# Patient Record
Sex: Female | Born: 2000 | Hispanic: No | Marital: Single | Smoking: Never smoker
Health system: Southern US, Community
[De-identification: ages and names within clinical notes are randomized; demographics above are authoritative.]

## PROBLEM LIST (undated history)

## (undated) HISTORY — PX: LYMPH NODE BIOPSY: SHX201

---
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
## 2021-11-17 ENCOUNTER — Ambulatory Visit (INDEPENDENT_AMBULATORY_CARE_PROVIDER_SITE_OTHER): Payer: No Typology Code available for payment source | Admitting: Podiatry

## 2021-11-17 ENCOUNTER — Ambulatory Visit (INDEPENDENT_AMBULATORY_CARE_PROVIDER_SITE_OTHER): Payer: No Typology Code available for payment source

## 2021-11-17 DIAGNOSIS — M722 Plantar fascial fibromatosis: Secondary | ICD-10-CM | POA: Diagnosis not present

## 2021-11-18 NOTE — Progress Notes (Signed)
  Subjective:  Patient ID: Shannon [name], female    DOB: 2001-04-17,  MRN: [id number]  Chief Complaint  Patient presents with   Plantar Fasciitis    21 y.o. female presents with the above complaint.  Patient presents with complaint left heel pain that has been going for about a year has progressed gotten worse.  She has not seen anyone else prior to seeing me.  She wanted to get it evaluated.  She states it hurts with ambulation and hurts with taking for step in the morning.  She has not tried anything else for it.  She has tried some stretching none of which has helped.  She would like to discuss treatment options for that.  Pain scale 7 out of 10.   Review of Systems: Negative except as noted in the HPI. Denies N/V/F/Ch.  No past medical history on file.  Current Outpatient Medications:    BENZACLIN gel, Apply topically 2 (two) times daily., Disp: 50 g, Rfl: 3   polyethylene glycol powder (GLYCOLAX/MIRALAX) powder, Take one capful daily., Disp: 255 g, Rfl: 0   triamcinolone cream (KENALOG) 0.1 %, Apply 1 application topically 2 (two) times daily., Disp: 30 g, Rfl: 1  Social History   Tobacco Use  Smoking Status Never  Smokeless Tobacco Not on file    No Known Allergies Objective:  There were no vitals filed for this visit. There is no height or weight on file to calculate BMI. Constitutional Well developed. Well nourished.  Vascular Dorsalis pedis pulses palpable bilaterally. Posterior tibial pulses palpable bilaterally. Capillary refill normal to all digits.  No cyanosis or clubbing noted. Pedal hair growth normal.  Neurologic Normal speech. Oriented to person, place, and time. Epicritic sensation to light touch grossly present bilaterally.  Dermatologic Nails well groomed and normal in appearance. No open wounds. No skin lesions.  Orthopedic: Normal joint ROM without pain or crepitus bilaterally. No visible deformities. Tender to palpation at the calcaneal tuber  left. No pain with calcaneal squeeze left. Ankle ROM diminished range of motion left. Silfverskiold Test: positive left.   Radiographs: Taken and reviewed. No acute fractures or dislocations. No evidence of stress fracture.  Plantar heel spur present. Posterior heel spur absent.  Mild pes planovalgus foot deformity noted  Assessment:   1. Plantar fasciitis of left foot    Plan:  Patient was evaluated and treated and all questions answered.  Plantar Fasciitis, left - XR reviewed as above.  - Educated on icing and stretching. Instructions given.  - Injection delivered to the plantar fascia as below. - DME: Plantar fascial brace dispensed to support the medial longitudinal arch of the foot and offload pressure from the heel and prevent arch collapse during weightbearing - Pharmacologic management: None  Procedure: Injection Tendon/Ligament Location: Left plantar fascia at the glabrous junction; medial approach. Skin Prep: alcohol Injectate: 0.5 cc 0.5% marcaine plain, 0.5 cc of 1% Lidocaine, 0.5 cc kenalog 10. Disposition: Patient tolerated procedure well. Injection site dressed with a band-aid.  No follow-ups on file.

## 2021-12-16 ENCOUNTER — Ambulatory Visit (INDEPENDENT_AMBULATORY_CARE_PROVIDER_SITE_OTHER): Payer: No Typology Code available for payment source | Admitting: Podiatry

## 2021-12-16 DIAGNOSIS — M7732 Calcaneal spur, left foot: Secondary | ICD-10-CM | POA: Diagnosis not present

## 2021-12-16 DIAGNOSIS — M722 Plantar fascial fibromatosis: Secondary | ICD-10-CM | POA: Diagnosis not present

## 2021-12-16 NOTE — Progress Notes (Signed)
  Subjective:  Patient ID: Shannon [name], female    DOB: 2000-06-21,  MRN: [id number]  Chief Complaint  Patient presents with   Plantar Fasciitis    21 y.o. female presents with the above complaint.  Patient presents with complaint left plantar fasciitis.  She states that she is doing much better.  She still has some residual pain but overall her pain has improved considerably.  She denies any other acute complaints.   Review of Systems: Negative except as noted in the HPI. Denies N/V/F/Ch.  No past medical history on file.  Current Outpatient Medications:    BENZACLIN gel, Apply topically 2 (two) times daily., Disp: 50 g, Rfl: 3   polyethylene glycol powder (GLYCOLAX/MIRALAX) powder, Take one capful daily., Disp: 255 g, Rfl: 0   triamcinolone cream (KENALOG) 0.1 %, Apply 1 application topically 2 (two) times daily., Disp: 30 g, Rfl: 1  Social History   Tobacco Use  Smoking Status Never  Smokeless Tobacco Not on file    No Known Allergies Objective:  There were no vitals filed for this visit. There is no height or weight on file to calculate BMI. Constitutional Well developed. Well nourished.  Vascular Dorsalis pedis pulses palpable bilaterally. Posterior tibial pulses palpable bilaterally. Capillary refill normal to all digits.  No cyanosis or clubbing noted. Pedal hair growth normal.  Neurologic Normal speech. Oriented to person, place, and time. Epicritic sensation to light touch grossly present bilaterally.  Dermatologic Nails well groomed and normal in appearance. No open wounds. No skin lesions.  Orthopedic: Normal joint ROM without pain or crepitus bilaterally. No visible deformities. Tender to palpation at the calcaneal tuber left. No pain with calcaneal squeeze left. Ankle ROM diminished range of motion left. Silfverskiold Test: positive left.   Radiographs: Taken and reviewed. No acute fractures or dislocations. No evidence of stress fracture.   Plantar heel spur present. Posterior heel spur absent.  Mild pes planovalgus foot deformity noted  Assessment:   No diagnosis found.  Plan:  Patient was evaluated and treated and all questions answered.  Plantar Fasciitis, left with underlying heel spur - XR reviewed as above.  - Educated on icing and stretching. Instructions given.  -Second injection delivered to the plantar fascia as below. - DME: Plantar fascial brace dispensed to support the medial longitudinal arch of the foot and offload pressure from the heel and prevent arch collapse during weightbearing - Pharmacologic management: None -I discussed power steps orthotics.  She states she will obtain them.  If it does not help we will discuss custom orthotics  Procedure: Injection Tendon/Ligament Location: Left plantar fascia at the glabrous junction; medial approach. Skin Prep: alcohol Injectate: 0.5 cc 0.5% marcaine plain, 0.5 cc of 1% Lidocaine, 0.5 cc kenalog 10. Disposition: Patient tolerated procedure well. Injection site dressed with a band-aid.  No follow-ups on file.

## 2022-02-20 ENCOUNTER — Emergency Department (HOSPITAL_COMMUNITY)
Admission: EM | Admit: 2022-02-20 | Discharge: 2022-02-20 | Disposition: A | Discharge status: Home / Self Care | Payer: Self-pay | Attending: Emergency Medicine | Admitting: Emergency Medicine

## 2022-02-20 ENCOUNTER — Encounter (HOSPITAL_COMMUNITY): Payer: Self-pay

## 2022-02-20 ENCOUNTER — Other Ambulatory Visit: Payer: Self-pay

## 2022-02-20 ENCOUNTER — Emergency Department (HOSPITAL_COMMUNITY): Payer: Self-pay

## 2022-02-20 DIAGNOSIS — R1011 Right upper quadrant pain: Secondary | ICD-10-CM | POA: Insufficient documentation

## 2022-02-20 DIAGNOSIS — R109 Unspecified abdominal pain: Secondary | ICD-10-CM

## 2022-02-20 DIAGNOSIS — R739 Hyperglycemia, unspecified: Secondary | ICD-10-CM | POA: Insufficient documentation

## 2022-02-20 DIAGNOSIS — R197 Diarrhea, unspecified: Secondary | ICD-10-CM | POA: Insufficient documentation

## 2022-02-20 LAB — CBC WITH DIFFERENTIAL/PLATELET
Abs Immature Granulocytes: 0.03 10*3/uL (ref 0.00–0.07)
Basophils Absolute: 0.1 10*3/uL (ref 0.0–0.1)
Basophils Relative: 1 %
Eosinophils Absolute: 0.1 10*3/uL (ref 0.0–0.5)
Eosinophils Relative: 1 %
HCT: 35.8 % — ABNORMAL LOW (ref 36.0–46.0)
Hemoglobin: 12 g/dL (ref 12.0–15.0)
Immature Granulocytes: 0 %
Lymphocytes Relative: 27 %
Lymphs Abs: 2.5 10*3/uL (ref 0.7–4.0)
MCH: 28.8 pg (ref 26.0–34.0)
MCHC: 33.5 g/dL (ref 30.0–36.0)
MCV: 85.9 fL (ref 80.0–100.0)
Monocytes Absolute: 0.5 10*3/uL (ref 0.1–1.0)
Monocytes Relative: 6 %
Neutro Abs: 6 10*3/uL (ref 1.7–7.7)
Neutrophils Relative %: 65 %
Platelets: 276 10*3/uL (ref 150–400)
RBC: 4.17 MIL/uL (ref 3.87–5.11)
RDW: 13.3 % (ref 11.5–15.5)
WBC: 9.2 10*3/uL (ref 4.0–10.5)
nRBC: 0 % (ref 0.0–0.2)

## 2022-02-20 LAB — URINALYSIS, ROUTINE W REFLEX MICROSCOPIC
Bilirubin Urine: NEGATIVE
Glucose, UA: NEGATIVE mg/dL
Hgb urine dipstick: NEGATIVE
Ketones, ur: NEGATIVE mg/dL
Leukocytes,Ua: NEGATIVE
Nitrite: NEGATIVE
Protein, ur: NEGATIVE mg/dL
Specific Gravity, Urine: 1.008 (ref 1.005–1.030)
pH: 6 (ref 5.0–8.0)

## 2022-02-20 LAB — COMPREHENSIVE METABOLIC PANEL
ALT: 17 U/L (ref 0–44)
AST: 18 U/L (ref 15–41)
Albumin: 4.2 g/dL (ref 3.5–5.0)
Alkaline Phosphatase: 56 U/L (ref 38–126)
Anion gap: 6 (ref 5–15)
BUN: 10 mg/dL (ref 6–20)
CO2: 24 mmol/L (ref 22–32)
Calcium: 9.3 mg/dL (ref 8.9–10.3)
Chloride: 108 mmol/L (ref 98–111)
Creatinine, Ser: 0.65 mg/dL (ref 0.44–1.00)
GFR, Estimated: >60 mL/min (ref >60)
Glucose, Bld: 116 mg/dL — ABNORMAL HIGH (ref 70–99)
Potassium: 3.7 mmol/L (ref 3.5–5.1)
Sodium: 138 mmol/L (ref 135–145)
Total Bilirubin: 0.9 mg/dL (ref 0.3–1.2)
Total Protein: 7.7 g/dL (ref 6.5–8.1)

## 2022-02-20 LAB — POC URINE PREG, ED: Preg Test, Ur: NEGATIVE

## 2022-02-20 LAB — LIPASE, BLOOD: Lipase: 30 U/L (ref 11–51)

## 2022-02-20 MED ORDER — ALUM & MAG HYDROXIDE-SIMETH 200-200-20 MG/5ML PO SUSP
30.0000 mL | Freq: Once | ORAL | Status: AC
  Administered 2022-02-20: 30 mL via ORAL
  Filled 2022-02-20: qty 30

## 2022-02-20 MED ORDER — DICYCLOMINE HCL 20 MG PO TABS: 20.0000 mg | ORAL_TABLET | Freq: Two times a day (BID) | ORAL | 0 refills | Status: AC

## 2022-02-20 MED ORDER — FAMOTIDINE 20 MG PO TABS
20.0000 mg | ORAL_TABLET | Freq: Once | ORAL | Status: AC
  Administered 2022-02-20: 20 mg via ORAL
  Filled 2022-02-20: qty 1

## 2022-02-20 MED ORDER — LIDOCAINE VISCOUS HCL 2 % MT SOLN
15.0000 mL | Freq: Once | OROMUCOSAL | Status: AC
  Administered 2022-02-20: 15 mL via ORAL
  Filled 2022-02-20: qty 15

## 2022-02-20 NOTE — ED Provider Triage Note (Signed)
Emergency Medicine Provider Triage Evaluation Note  Tarini Carrier , a 21 y.o. female  was evaluated in triage.  Pt complains of abdominal pain. Started on Saturday morning. Pain woke her up from sleep. Located in right mid abdomen. Constant and worse. Dull in sensation. Not taken any meds. Small amount of diarrhea. No vomiting or nausea. No fever. No hematuria, blood stools, hematemesis.   Review of Systems  Positive: See above Negative: See above  Physical Exam  BP 125/73 (BP Location: Left Arm)   Pulse 85   Temp 98.4 F (36.9 C) (Oral)   Resp 16   Ht 5\' 4"  (1.626 m)   Wt 68 kg   LMP 01/20/2022   SpO2 100%   BMI 25.73 kg/m  Gen:   Awake, no distress   Resp:  Normal effort  MSK:   Moves extremities without difficulty  Other:    Medical Decision Making  Medically screening exam initiated at 12:28 PM.  Appropriate orders placed.  Ellinore Merced was informed that the remainder of the evaluation will be completed by another provider, this initial triage assessment does not replace that evaluation, and the importance of remaining in the ED until their evaluation is complete.  Plan: ab labs, UA, u preg   Harriet Pho, PA-C 02/20/22 1234

## 2022-02-20 NOTE — Discharge Instructions (Addendum)
Work-up for abdominal pain was overall reassuring. Ultrasound did not reveal any concerning findings that would need to be treated.  Recommend that you follow-up with your PCP regarding ongoing abdominal pain.  Recommend Tylenol and ibuprofen as needed for pain.  If you have new bloody urine, bloody stools, bloody vomit, worsening abdominal pain please return to the emergency department for further evaluation. I have ordered Bentyl which is a GI antispasmodic which can also be taken as needed for abdominal pain.

## 2022-02-20 NOTE — ED Triage Notes (Signed)
Pt reports right sided abd pain and diarrhea worse with movement, palpitation, and eating x2 days

## 2022-02-20 NOTE — ED Provider Notes (Signed)
[facility] DEPT Provider Note   CSN: [id number] Arrival date & time: 02/20/22  1203     History  Chief Complaint  Patient presents with   Abdominal Pain   HPI Shannon [name] is a 21 y.o. female presenting for abdominal pain. Started on Saturday morning. Pain woke her up from sleep. Located in right mid abdomen. Constant and worse. Dull in sensation. Not taken any meds. Small amount of diarrhea. No vomiting or nausea. No fever. No hematuria, blood stools, hematemesis.   Abdominal Pain     Home Medications Prior to Admission medications   Medication Sig Start Date End Date Taking? Authorizing Provider  dicyclomine (BENTYL) 20 MG tablet Take 1 tablet (20 mg total) by mouth 2 (two) times daily. 02/20/22  Yes Harriet Pho, PA-C  BENZACLIN gel Apply topically 2 (two) times daily. Patient not taking: Reported on 02/20/2022 04/12/15   Ok Edwards, MD  polyethylene glycol powder (GLYCOLAX/MIRALAX) powder Take one capful daily. Patient not taking: Reported on 02/20/2022 05/22/16   Cecille Po, MD  triamcinolone cream (KENALOG) 0.1 % Apply 1 application topically 2 (two) times daily. Patient not taking: Reported on 02/20/2022 05/23/16   Roselind Messier, MD      Allergies    Patient has no known allergies.    Review of Systems   Review of Systems  Gastrointestinal:  Positive for abdominal pain.    Physical Exam Updated Vital Signs BP 108/63   Pulse 70   Temp 98.6 F (37 C) (Oral)   Resp 16   Ht 5\' 4"  (1.626 m)   Wt 68 kg   LMP 01/20/2022   SpO2 100%   BMI 25.73 kg/m  Physical Exam Vitals and nursing note reviewed.  HENT:     Head: Normocephalic and atraumatic.     Mouth/Throat:     Mouth: Mucous membranes are moist.  Eyes:     General:        Right eye: No discharge.        Left eye: No discharge.     Conjunctiva/sclera: Conjunctivae normal.  Cardiovascular:     Rate and Rhythm: Normal rate and regular rhythm.      Pulses: Normal pulses.     Heart sounds: Normal heart sounds.  Pulmonary:     Effort: Pulmonary effort is normal.     Breath sounds: Normal breath sounds.  Abdominal:     General: Abdomen is flat.     Palpations: Abdomen is soft.     Tenderness: There is abdominal tenderness in the right upper quadrant.  Skin:    General: Skin is warm and dry.  Neurological:     General: No focal deficit present.  Psychiatric:        Mood and Affect: Mood normal.     ED Results / Procedures / Treatments   Labs (all labs ordered are listed, but only abnormal results are displayed) Labs Reviewed  CBC WITH DIFFERENTIAL/PLATELET - Abnormal; Notable for the following components:      Result Value   HCT 35.8 (*)    All other components within normal limits  COMPREHENSIVE METABOLIC PANEL - Abnormal; Notable for the following components:   Glucose, Bld 116 (*)    All other components within normal limits  URINALYSIS, ROUTINE W REFLEX MICROSCOPIC - Abnormal; Notable for the following components:   Color, Urine STRAW (*)    APPearance HAZY (*)    All other components within normal limits  LIPASE, BLOOD  POC URINE PREG, ED    EKG None  Radiology US Abdomen Limited RUQ (LIVER/GB)  Result Date: 02/20/2022 CLINICAL DATA:  Right upper quadrant abdominal pain for 3 days. EXAM: ULTRASOUND ABDOMEN LIMITED RIGHT UPPER QUADRANT COMPARISON:  None Available. FINDINGS: Gallbladder: The gallbladder is mildly contracted. No gallstones, wall thickening or pericholecystic fluid. Negative sonographic Murphy sign. Common bile duct: Diameter: 2.0 mm Liver: Normal echogenicity without focal lesion or biliary dilatation. Portal vein is patent on color Doppler imaging with normal direction of blood flow towards the liver. Other: None. IMPRESSION: Unremarkable right upper quadrant ultrasound examination. Electronically Signed   By: Rudie Meyer M.D.   On: 02/20/2022 16:22    Procedures Procedures    Medications  Ordered in ED Medications  famotidine (PEPCID) tablet 20 mg (20 mg Oral Given 02/20/22 1610)  alum & mag hydroxide-simeth (MAALOX/MYLANTA) 200-200-20 MG/5ML suspension 30 mL (30 mLs Oral Given 02/20/22 1611)    And  lidocaine (XYLOCAINE) 2 % viscous mouth solution 15 mL (15 mLs Oral Given 02/20/22 1611)    ED Course/ Medical Decision Making/ A&P                           Medical Decision Making Amount and/or Complexity of Data Reviewed Labs: ordered. Radiology: ordered.  Risk OTC drugs. Prescription drug management.   This patient presents to the ED for concern of abdominal pain, this involves a number of treatment options, and is a complaint that carries with it a high risk of complications and morbidity.  The differential diagnosis includes gallbladder pathology, pancreatitis, and constipation.   Co morbidities: Discussed in HPI    EMR reviewed including pt PMHx, past surgical history and past visits to ER.   See HPI for more details   Lab Tests:  I ordered and independently interpreted labs. Labs notable for hyperglycemia.    Imaging Studies:  NAD. I personally reviewed all imaging studies and no acute abnormality found. I agree with radiology interpretation.    Cardiac Monitoring:  The patient was maintained on a cardiac monitor.  I personally viewed and interpreted the cardiac monitored which showed an underlying rhythm of: NSR NA   Medicines ordered:  I ordered medication including Maalox and Xylocaine and Pepcid for abdominal pain Reevaluation of the patient after these medicines showed that the patient stayed the same I have reviewed the patients home medicines and have made adjustments as needed    Consults/Attending Physician   I discussed this case with my attending physician who cosigned this note including patient's presenting symptoms, physical exam, and planned diagnostics and interventions. Attending physician stated agreement with plan or  made changes to plan which were implemented.   Reevaluation:  After the interventions noted above I re-evaluated patient and found that they have : Stayed the same   Problem List / ED Course: Patient presented for abdominal pain.  Overall exam was unremarkable with only mild mid right abdominal tenderness.  Labs overall reassuring but patient continued to endorse abdominal pain. Ordered Pepcid and Maalox and viscous lidocaine and ordered ultrasound of liver and gallbladder which was unremarkable. On reevaluation patient stated that symptoms did not improve after medications.  Advised patient to follow-up with PCP.  Advised ibuprofen and Tylenol for pain as needed.  Also prescribed Bentyl to be taken for potential antispasmodic  Dispostion:  After consideration of the diagnostic results and the patients response to treatment, I feel that the patent would benefit from discharge  home and follow-up PCP for ongoing abdominal pain.         Final Clinical Impression(s) / ED Diagnoses Final diagnoses:  Abdominal pain, unspecified abdominal location    Rx / DC Orders ED Discharge Orders          Ordered    dicyclomine (BENTYL) 20 MG tablet  2 times daily        02/20/22 1721              Gareth Eagle, PA-C 02/20/22 1723    Sloan Leiter, DO 02/24/22 2346
